# Patient Record
Sex: Female | Born: 1945 | Race: White | Hispanic: No | Marital: Single
Health system: Southern US, Community
[De-identification: ages and names within clinical notes are randomized; demographics above are authoritative.]

---

## 1998-02-02 ENCOUNTER — Ambulatory Visit (HOSPITAL_COMMUNITY): Admission: RE | Admit: 1998-02-02 | Discharge: 1998-02-02 | Payer: Self-pay | Admitting: Obstetrics and Gynecology

## 1998-02-02 ENCOUNTER — Encounter: Payer: Self-pay | Admitting: Obstetrics and Gynecology

## 1998-02-14 ENCOUNTER — Ambulatory Visit (HOSPITAL_COMMUNITY): Admission: RE | Admit: 1998-02-14 | Discharge: 1998-02-14 | Payer: Self-pay | Admitting: Neurosurgery

## 1998-11-14 ENCOUNTER — Encounter: Payer: Self-pay | Admitting: Family Medicine

## 1998-11-14 ENCOUNTER — Ambulatory Visit (HOSPITAL_COMMUNITY): Admission: RE | Admit: 1998-11-14 | Discharge: 1998-11-14 | Payer: Self-pay | Admitting: Family Medicine

## 1999-12-15 ENCOUNTER — Ambulatory Visit (HOSPITAL_COMMUNITY): Admission: RE | Admit: 1999-12-15 | Discharge: 1999-12-15 | Payer: Self-pay | Admitting: Family Medicine

## 1999-12-15 ENCOUNTER — Encounter: Payer: Self-pay | Admitting: Family Medicine

## 2001-08-25 ENCOUNTER — Encounter: Payer: Self-pay | Admitting: Family Medicine

## 2001-08-25 ENCOUNTER — Ambulatory Visit (HOSPITAL_COMMUNITY): Admission: RE | Admit: 2001-08-25 | Discharge: 2001-08-25 | Payer: Self-pay | Admitting: Family Medicine

## 2002-08-29 ENCOUNTER — Encounter: Payer: Self-pay | Admitting: Orthopedic Surgery

## 2002-08-29 ENCOUNTER — Ambulatory Visit (HOSPITAL_COMMUNITY): Admission: RE | Admit: 2002-08-29 | Discharge: 2002-08-29 | Payer: Self-pay | Admitting: Otolaryngology

## 2003-02-25 ENCOUNTER — Ambulatory Visit (HOSPITAL_COMMUNITY): Admission: RE | Admit: 2003-02-25 | Discharge: 2003-02-25 | Payer: Self-pay | Admitting: Specialist

## 2003-02-25 ENCOUNTER — Encounter: Payer: Self-pay | Admitting: Specialist

## 2003-08-04 ENCOUNTER — Inpatient Hospital Stay (HOSPITAL_COMMUNITY): Admission: RE | Admit: 2003-08-04 | Discharge: 2003-08-09 | Payer: Self-pay | Admitting: Specialist

## 2004-04-28 ENCOUNTER — Ambulatory Visit (HOSPITAL_COMMUNITY): Admission: RE | Admit: 2004-04-28 | Discharge: 2004-04-28 | Payer: Self-pay | Admitting: Family Medicine

## 2005-01-11 IMAGING — CR DG KNEE 1-2V*L*
2 series · 2 of 2 positions shown · non-contrast
Comparison: none

CLINICAL DATA: Status-post total left knee replacement. 
 TWO VIEW LEFT KNEE

[view not recorded (1 of 2)]
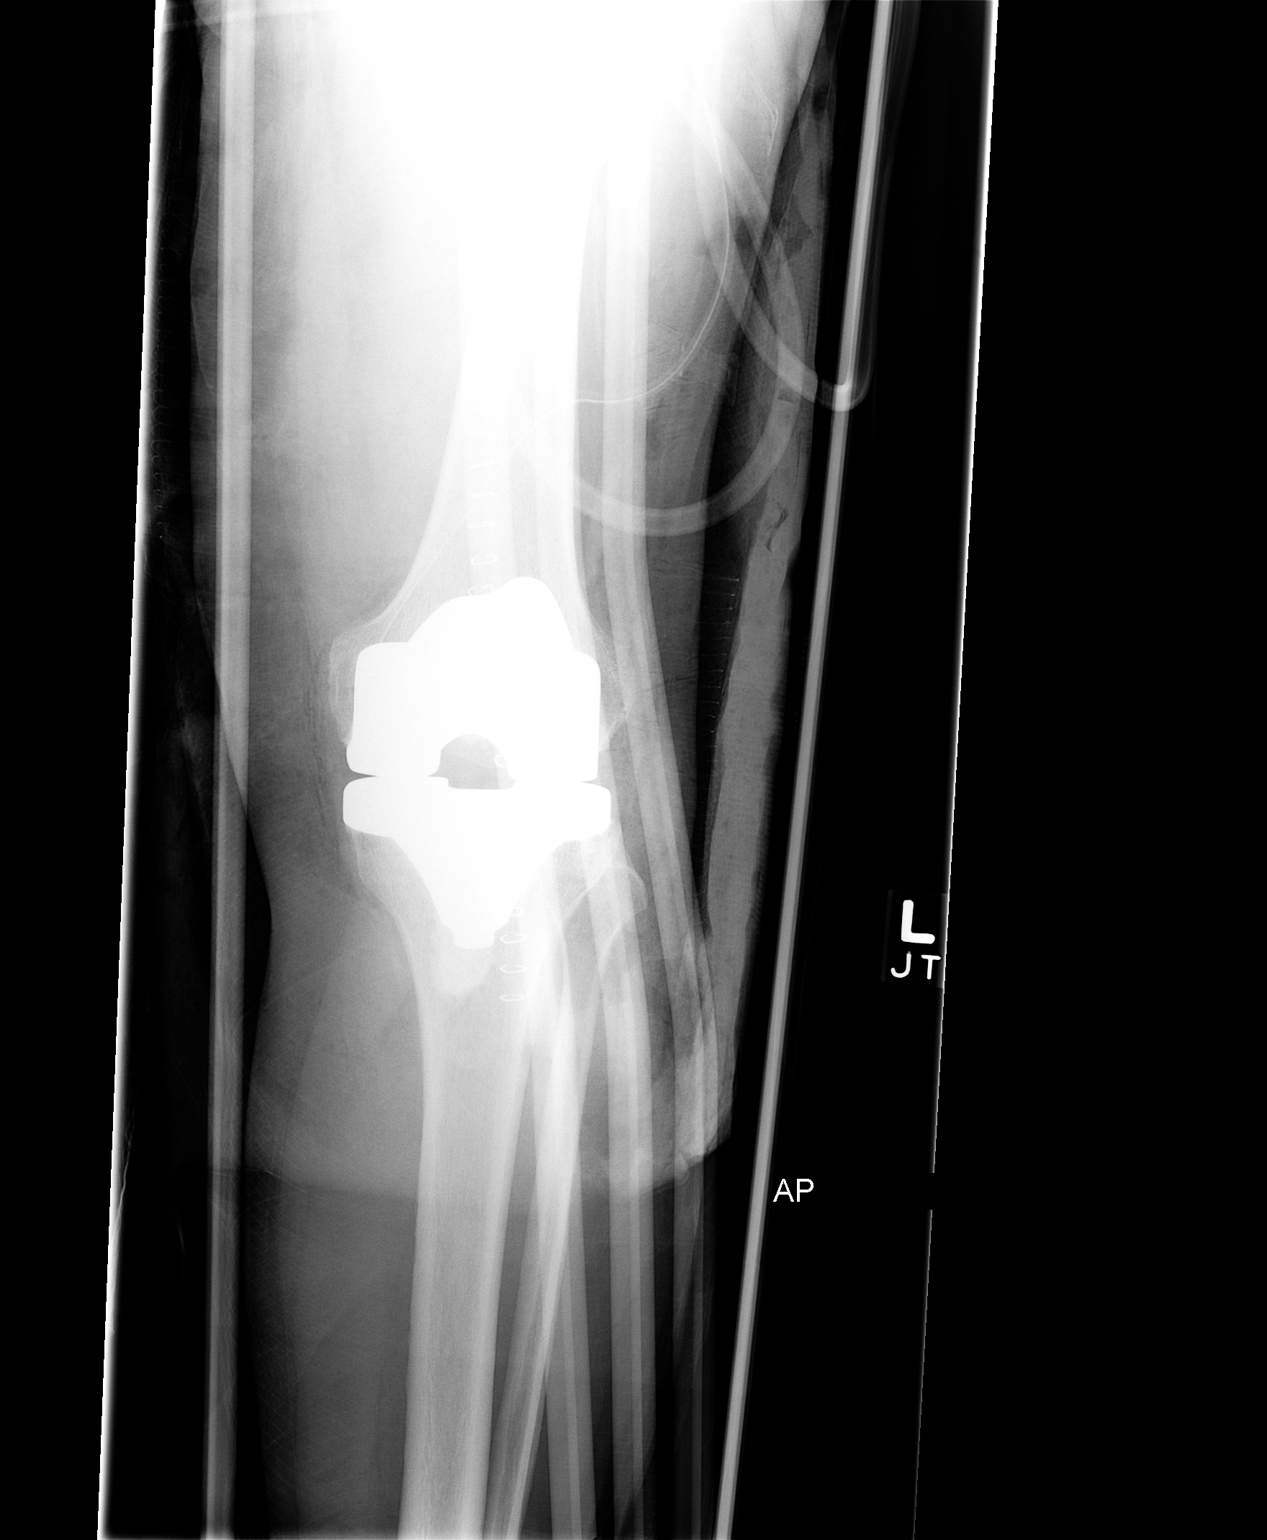

[view not recorded (2 of 2)]
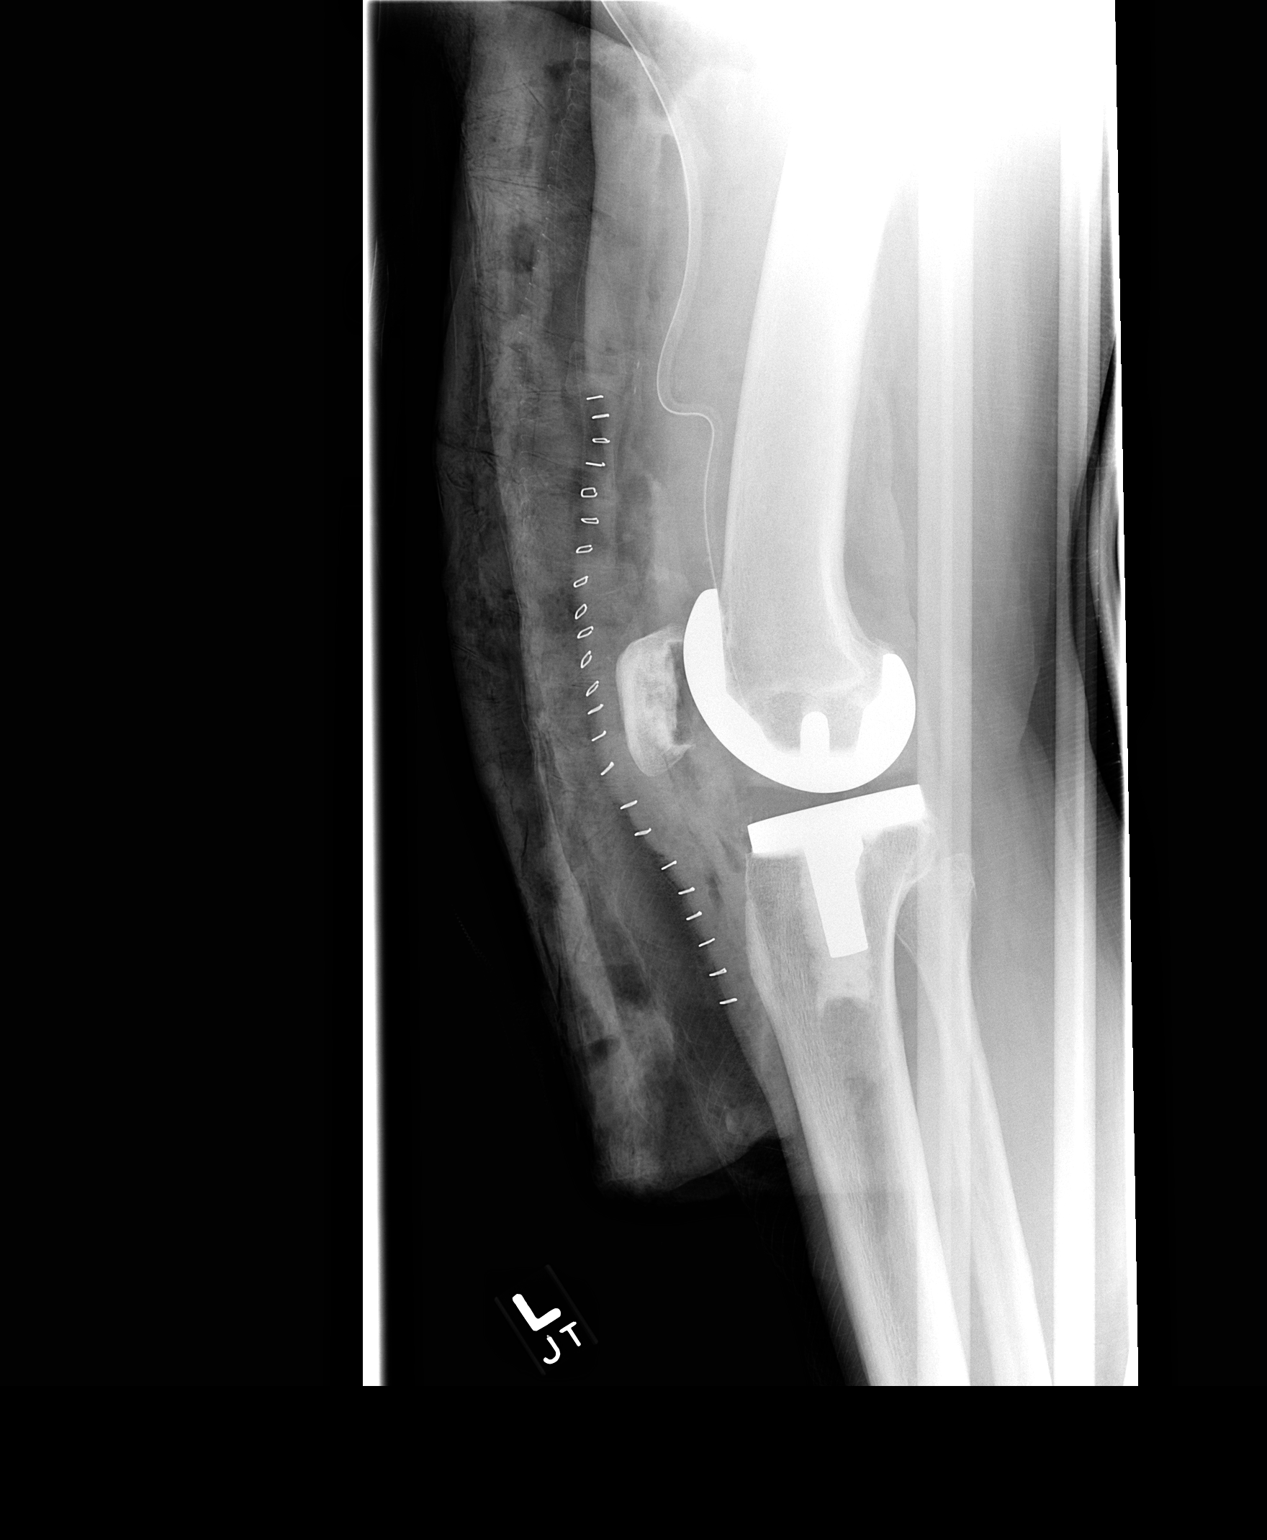

[2 of 2 positions shown; findings below may reference images not displayed]

FINDINGS: Comparison to 07/27/03.  Evidence of left total knee replacement without definite complicating features.  Post-operative changes and overlying drains noted. 
 IMPRESSION
 Left knee replacement without definite complicating features.

## 2005-09-05 ENCOUNTER — Encounter: Payer: Self-pay | Admitting: Neurosurgery

## 2006-07-08 ENCOUNTER — Ambulatory Visit (HOSPITAL_COMMUNITY): Admission: RE | Admit: 2006-07-08 | Discharge: 2006-07-08 | Payer: Self-pay | Admitting: Obstetrics and Gynecology

## 2007-07-10 ENCOUNTER — Ambulatory Visit (HOSPITAL_COMMUNITY): Admission: RE | Admit: 2007-07-10 | Discharge: 2007-07-10 | Payer: Self-pay | Admitting: Family Medicine

## 2007-07-10 ENCOUNTER — Other Ambulatory Visit: Admission: RE | Admit: 2007-07-10 | Discharge: 2007-07-10 | Payer: Self-pay | Admitting: Family Medicine

## 2007-07-21 ENCOUNTER — Encounter: Admission: RE | Admit: 2007-07-21 | Discharge: 2007-07-21 | Payer: Self-pay | Admitting: Family Medicine

## 2008-01-07 ENCOUNTER — Encounter: Admission: RE | Admit: 2008-01-07 | Discharge: 2008-01-07 | Payer: Self-pay | Admitting: Family Medicine

## 2008-07-12 ENCOUNTER — Encounter: Admission: RE | Admit: 2008-07-12 | Discharge: 2008-07-12 | Payer: Self-pay | Admitting: Obstetrics and Gynecology

## 2009-07-14 ENCOUNTER — Ambulatory Visit (HOSPITAL_COMMUNITY): Admission: RE | Admit: 2009-07-14 | Discharge: 2009-07-14 | Payer: Self-pay | Admitting: Family Medicine

## 2010-06-13 ENCOUNTER — Other Ambulatory Visit (HOSPITAL_COMMUNITY): Payer: Self-pay | Admitting: Family Medicine

## 2010-06-13 DIAGNOSIS — Z1231 Encounter for screening mammogram for malignant neoplasm of breast: Secondary | ICD-10-CM

## 2010-07-18 ENCOUNTER — Ambulatory Visit (HOSPITAL_COMMUNITY)
Admission: RE | Admit: 2010-07-18 | Discharge: 2010-07-18 | Disposition: A | Discharge status: Home / Self Care | Payer: MEDICARE | Source: Ambulatory Visit | Attending: Family Medicine | Admitting: Family Medicine

## 2010-07-18 DIAGNOSIS — Z1231 Encounter for screening mammogram for malignant neoplasm of breast: Secondary | ICD-10-CM

## 2010-07-20 ENCOUNTER — Other Ambulatory Visit: Payer: Self-pay | Admitting: Family Medicine

## 2010-07-20 DIAGNOSIS — R928 Other abnormal and inconclusive findings on diagnostic imaging of breast: Secondary | ICD-10-CM

## 2010-07-26 ENCOUNTER — Ambulatory Visit
Admission: RE | Admit: 2010-07-26 | Discharge: 2010-07-26 | Disposition: A | Discharge status: Home / Self Care | Payer: MEDICARE | Source: Ambulatory Visit | Attending: Family Medicine | Admitting: Family Medicine

## 2010-07-26 DIAGNOSIS — R928 Other abnormal and inconclusive findings on diagnostic imaging of breast: Secondary | ICD-10-CM

## 2010-09-22 NOTE — Op Note (Signed)
NAME:  Meghan Black, Meghan Black                       ACCOUNT NO.:  [id number]   MEDICAL RECORD NO.:  [id number]                   PATIENT TYPE:  AMB   LOCATION:  DAY                                  FACILITY:  [facility]   PHYSICIAN:  Jene Every, M.D.                 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  Grade 3 chondromalacia of the patella, left knee.   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  Grade 3 chondromalacia of the patella, left knee.   PROCEDURE:  1. Left knee arthroscopy.  2. Chondroplasty of the patella, left knee.  3. Chondroplasty of the medial tibial plateau, micro-fracture technique,     median tibial plateau, left knee.   SURGEON:  Jene Every, M.D.   ASSISTANT:  None.   ANESTHESIA:  General.   INDICATIONS FOR PROCEDURE:  This is a 65 year old with refractory knee pain,  worse with activity and better with rest.  An MRI indicated a focal ostial  chondral lesion of the medial tibial plateau and degenerative change of the  patella.  Operative intervention was indicated for a chondroplasty and  possible micro-fracture technique.  The risks and benefits were discussed,  including bleeding, infection, no change in symptoms, worsening of the  symptoms, need for a total knee arthroplasty, etc.   DESCRIPTION OF PROCEDURE:  With the patient in the supine position, after  adequate general anesthesia and 1 g of Kefzol and 1 g of vancomycin.  The  left lower extremity was prepped and draped in the usual sterile fashion.  The lateral parapatellar portal and the superior medial parapatellar portal  were fashioned with a #11 blade and engrossed skin was atraumatically  placed.  The area was divided from the incomplete joint.  An arthroscopic  camera was then inserted via the lateral portal.  On direct visualization  the medial parapatellar portal was fashioned with a #11 blade, after the  localization with a #18 gauge needle, sparing the medial meniscus.  Inspection in the suprapatellar pouch revealed some grade 3 changes in the  medial facet of the patella, some grade 3 changes of the femoral sulcus.  There was normal patellofemoral tracking.  The sulcus medially and laterally  was unremarkable.  The ACL and PCL were unremarkable.  Examination of the  lateral compartment revealed some minor grade 2 changes of the tibial  plateau.  The femoral condyle and lateral meniscus were without evidence of  degenerative fraying or tearing.  The meniscus was stable to full palpation.  The midportion of the weightbearing surface of the medial tibial plateau had  some grade 3 changes, and a focal lesion of grade 4 change approximately 5.0  mm.  An awl was introduced and utilization to micro-fracture the area of the  grade 4 lesion, 2.0 mm in depth.  This was spaced appropriately apart after  pressure was reduced.  There was good bleeding from each of the areas.  A  chondroplasty was performed of the medial tibial plateau, to remove all of  the loose cartilagineus debris.  Next, the knee was copiously lavaged.  All compartments were re-examined.  No evidence of loose cartilagineus debris.  The medial meniscus was without  evidence of a tear, and stable to full palpation.  Next, all instrumentation was removed.  The portals were closed with #4-0  nylon simple sutures.  Marcaine 0.25% with epinephrine was infiltrated in  the joint.  The wounds were dressed sterilely.  The patient was awakened without difficulty and transported to the recovery  room in satisfactory condition.  The patient tolerated the procedure well.   COMPLICATIONS:  None.                                                 Jene Every, M.D.    Cordelia Pen  D:  02/25/2003   T:  02/25/2003  Job:  [id number]

## 2010-09-22 NOTE — Op Note (Signed)
Meghan Black, Meghan Black                       ACCOUNT NO.:  [id number]   MEDICAL RECORD NO.:  [id number]                   PATIENT TYPE:  INP   LOCATION:  0002                                 FACILITY:  [facility]   PHYSICIAN:  Jene Every, M.D.                 DATE OF BIRTH:  08/12/1945   DATE OF PROCEDURE:  08/04/2003  DATE OF DISCHARGE:                                 OPERATIVE REPORT   PREOPERATIVE DIAGNOSIS:  Degenerative joint disease, left knee.   POSTOPERATIVE DIAGNOSIS:  Degenerative joint disease, left knee.   PROCEDURE PERFORMED:  Left total knee arthroplasty.   ANESTHESIA:  General.   SURGEON:  Javier Docker, M.D.   ASSISTANT:  Roma Schanz, P.A.   COMPONENTS UTILIZED:  Osteonics posterior cruciate sacrificing 5 femur, 5  tibia, 10 mm insert, 26 patella.   HISTORY:  A 65 year old is status post a micro-fracture technique of grade 4  lesion of the medial tibial plateau.  Progressive pain from the procedure,  joint space narrowing, and patellofemoral pain.  Also had lateral joint line  tenderness.  Patellofemoral pain and compression.  She had a tibia vara.  She developed a flexion contracture preoperatively.  She had been refractory  to conservative treatment including corticosteroid injections, anti-  inflammatory medications.  It was disabling to her.  Was unable to perform  activities of daily living.  The patient had no evidence of extraarticular  pathology.  Operative intervention was indicated for placement of the  degenerated joint.  Risks and benefits discussed including bleeding,  infection, damage to vascular structures, suboptimal range of motion, need  for hardware removal, revision, etc.   TECHNIQUE:  The patient in supine position.  After induction of adequate  general anesthesia and 500 mg of vancomycin, the left lower extremity was  prepped and draped and exsanguinated in the usual sterile fashion.  She had  approximately a 5-10 degree  flexion contracture.  She also had a fairly  significant tibia bowing, more distally than proximally.  She had an axially  rotated tibial tubercle as well.  I made a midline incision over the  patella.  The subcutaneous tissue was dissected.  Electrocautery utilized to  achieve hemostasis.  Medium parapatellar arthrotomy was performed.  The  patella was everted, knee was flexed.  Grade 4 changes were noted throughout  the medial compartment, both on the femoral side as well as on the tibial  side.  There was denuded medial meniscus.  There was some extensive grade 3  and some minor grade 4 changes of the patellofemoral joint as well.  The ACL  was removed.  The medial and lateral menisci was removed.  Elevated the  medial retinaculum and the medial superficial medial collateral ligament.  The knee was flexed.  Step-drill was utilized to enter femoral canal.  This  was irrigated prior to placement of the intramedullary guide.  A 5-degree  left was  utilized, 10 mm, 10 to the distal femur.  Distal femoral cut was  then performed.  Attempt to place distal femoral sizing guide, were unable  to place the sizing guide, therefore I turned to making a proximal tibia  cut, used, the McHale, subluxed the tibia, recessed the PCL.  Baseplate  applied after removing the tibial spine with an oscillating saw.  It was  trialed to a 5, was entered with a step-drill, irrigated, evacuated.  Intramedullary guide placed.  It was measured externally, inserted through  distal tibia.  Again, she had some fair amount of bowing of the tibia  distally.  We used 4 mm off the defect medially, pinned it at 0 degree with  the tissues well protected, oscillating saw utilized to make the tibial cut.  We used the external alignment guide here just to get a rough alignment  medial to the tibial tubercle parallel to the tibia.  Menisci cut was then  made.  Next, following this, we flexed the knee, and we were able then to   place this femoral sizing jig out on the distal femur.  This, too, first  utilized a 5, but felt that pinning for a 5 would notch the anterior femur.  Then down to a 7 __________ for a 7 and utilizing the 5 and felt that the 7  would overhang and provide anterior load to the patellofemoral joint, over-  stuffing that compartment.  I first placed a 5, impacted it, secured it  utilizing the trials to raise the anterior aspect of the femur appropriately  through an anterior and a posterior chamfer cuts.  Anterior and posterior  cuts to soft tissue, posterior well-protected, then remained of chamfer  cuts.  Utilized the posterior cruciate sacrificing, used the patellofemoral  groove chisel, then used the oscillating saw, channeled the punch guide for  the recess posterior cruciate sacrificing, then utilized the cutting block,  removing bone with the rongeur and then used the impactor without difficulty  plus sheeting.  Remnant in the PCL was removed as well.  Next, we trialed a  5 component, found to fit satisfactorily, anterior-posterior and medial  lateral plane and the 10 mm insert, 5 tibia.  It was felt though that the 10  mm insert was insufficient, as we did not achieve full extension.  Therefore, flexed the tibia, placed our retractors, and we then took two  additional mm off the tibial cut.  Following this, then the femoral trial  and the tibial trial fit appropriately, full extension, full flexion, no  lift-off.  Used an external alignment guide medial to the tibial tubercle,  parallel to the tibia, again distally due to the bowing of the tibia with  slight divergence.  We marked it in the appropriate rotation, then removed  the trials, flexed the tibia, subluxed it, pinned.  Our trial was fit  excellent.  I pinned the baseplate which fit excellently.  Then used their  two punch guides.  Then these are removed.  Again, in full extension and full flexion, there was an equal flexion and  extension gap, and the  alignment with the joint line and the floor was appropriate.  Then everted  the patella and sized to a 26, placed a patella clamp.  I measured the  patellar width.  It would accept a 10 mm cut.  We then used a reamer to ream  10 mm depth, used the peg holes.  These were drilled as well.  The  residual  thickness was 12 mm.  This was measured with a caliper.  Next, the knee was  copiously irrigated with pulsatile lavage.  We trialed the femur and the  tibia, full extension, full flexion, good stability to varus valgus  stressing.  No lift-off.  All instrumentation was then removed.  Used  pulsatile lavage to clean the soft tissues and the bone.  Knee was then  flexed after this, dried thoroughly, suctioned appropriately, placed Gelfoam  in the tibial canal as a cement restrictor.  We examined posteriorly, and  there were no osteophytes.  No residual meniscus.  After appropriate mixing  the cement, we injected it into the proximal tibia into the tibial cut.  The  #5 tibial tray impacted and redundant cement removed.  Impacted the femoral  component with cement on the posterior runners, redundant cement removed,  placed a 10 trial, brought her into extension, axial load applied, and  redundant cement removed.  We cemented the patellar component as well,  redundant cement removed after appropriate curing of the cement.  I removed  trial, redundant cement removed, checked the posterior runners of the  femoral component.  That was removed as well.  The cement was removed as  well.  We placed bone wax on the exposed cancellous surfaces, the femoral  and the tibial side.  We had full extension, full flexion, no lift off, and  no instability.  There was slight lateral tracking of the patella.  Therefore lateral retinacular release was performed, preserving the  superolateral geniculate vessels.  Next, the Hemovac was placed and brought  out through a lateral stab wound in the  skin.  Patellar arthrotomy repaired  with #1 Vicryl interrupted figure-of-eight sutures.  Subcutaneous tissue  reapproximated with 2-0 Vicryl simple sutures.  Knee flexion at closure was  again up to 140 degrees, full extension, no instability or gross varus  valgus stressing, good patellofemoral tracking.  After the compression  dressing was applied, the tourniquet was deflated at 2 hours, and there was  adequate revascularization in the left lower extremity appreciated.   The patient tolerated the procedure well with no complications.  Tourniquet  time was 2 hours.                                               Jene Every, M.D.    Cordelia Pen  D:  08/04/2003  T:  08/04/2003  Job:  [id number]

## 2010-09-22 NOTE — Discharge Summary (Signed)
NAMEBRENN, Meghan Black                       ACCOUNT NO.:  [id number]   MEDICAL RECORD NO.:  [id number]                   PATIENT TYPE:  INP   LOCATION:  0479                                 FACILITY:  [facility]   PHYSICIAN:  Jene Every, M.D.                 DATE OF BIRTH:  03-01-1946   DATE OF ADMISSION:  08/04/2003  DATE OF DISCHARGE:  08/09/2003                                 DISCHARGE SUMMARY   ADMISSION DIAGNOSES:  1. Degenerative joint disease left knee.  2. Thyroid disease.  3. Hypercholesterolemia.  4. History of anemia.  5. Hiatal hernia.   DISCHARGE DIAGNOSES:  1. 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  2. Thyroid disease.  3. Hypercholesterolemia.  4. History of anemia.  5. Hiatal hernia.   HISTORY:  Meghan Black is a 65 year old female with a longstanding history  of bilateral knee pain.  The patient did initially undergo bilateral knee  arthroscopies with good relief on the right; however, remained symptomatic  on the left. She underwent multiple cortisone injections as well as a  Synvisc series without significant relief of her symptoms.  MRI of the knee  did show high-grade patellofemoral chondromalacia as well as osteochondral  erosion in the medial tibial plateau.  At this point due to failure, the  patient is to proceed with conservative treatment.  It is felt she would  benefit from a total knee arthroplasty.  Risks and benefits of the surgery  were discussed with the patient by Dr. Shelle Iron and she wished to proceed.   CONSULTATIONS:  PT, OT, Case Management.   PROCEDURE:  The patient was taken to the OR on August 04, 2003 and underwent  left total knee arthroplasty by Dr. Jene Every, assistant Roma Schanz, P.A.-C., anesthesia general, complications none.  One Hemovac drain  was placed at the time of surgery.   LABORATORY VALUES:  Preoperative labs showed a white blood cell count 8.2,  hemoglobin  15.2, hematocrit 44.5.  Routine H&H's were followed throughout  the hospital course.  The patient did drop to a level of 10.4 hemoglobin  with hematocrit of 30.2.  She was stable at the time of discharge with a  hemoglobin of 10.6, hematocrit 31.2.  PT/INR done preoperatively showed PT  of 11.5, INR of 0.8.  Routine coagulation studies were followed throughout  the hospital course.  At the time of discharge, PT was 16.7, INR 1.6.  Routine chemistries done preoperatively showed sodium 143, potassium 5,  glucose 103, normal BUN and creatinine.  Routine chemistries were followed  throughout the hospital course.  Sodium remained normal at the time of  discharge with a level of 139, potassium 3.5.  Slightly elevated glucose of  118 with slight decrease in BUN of 5 with a normal creatinine of 0.7.  Routine liver functions were within normal range.  Routine urinalysis was  negative.  Blood type is A positive.  I do not see a preoperative chest x-  ray or EKG on the chart.   HOSPITAL COURSE:  The patient was taken to the OR and underwent the above-  stated procedure without difficulty.  She was then transferred to the PACU  and then to the orthopedic floor for continued postoperative care.  At time  of surgery, one Hemovac drain was placed and the patient was placed on PC  analgesics for pain relief.  Pharmacy was consulted to begin Coumadin  therapy.  On postoperative day #1, the patient did well.  She was not using  her PCA due to fear of being addicted to morphine.  This was discontinued.  She was started on p.o. medications.  PT/OT was consulted.  The patient did  have a drop in hemoglobin to 11.7, hematocrit 35; however, asymptomatic.  The patient continued to do well throughout the hospital course.  A routine  postoperative plan was followed.  Discharge planning was initiated.  On  postoperative day #5, it was felt the patient could be discharged home.  She  was advancing well with physical  therapy.  Home health had been arranged.  Incision remained clean and dry.  Hemoglobin stabilized at a level of  10.6/31.2 hematocrit.   DISPOSITION:  The patient discharged to home with [facility]  with Coumadin monitoring.   DISCHARGE MEDICATIONS:  Includes Coumadin, Percocet, and Robaxin, as well as  vitamin C.   ACTIVITY:  The patient can walk as tolerated.  She is to elevated her leg 4  to 6 times a day for 20 minutes at the time.  Physical therapy at home with  the use of knee immobilizer until can straight leg raise.   DIET:  No restrictions.   WOUND CARE:  She is to change her dressing daily, keep this clean and dry.  She is to contact us if she has an excess drainage or if fevers develop.   She will follow up with Dr. Shelle Iron in approximately 10 to 14 days  postoperatively.   CONDITION ON DISCHARGE:  Stable.   FINAL DIAGNOSES:  Doing well status post left total knee arthroplasty.     Roma Schanz, P.A.                   Jene Every, M.D.    CS/MEDQ  D:  08/18/2003  T:  08/18/2003  Job:  [id number]

## 2010-09-22 NOTE — H&P (Signed)
NAMECECILEY, Meghan Black                       ACCOUNT NO.:  [id number]   MEDICAL RECORD NO.:  [id number]                   PATIENT TYPE:  INP   LOCATION:  NA                                   [facility]ACILITY[facility]   PHYSICIAN:  Jene Every, M.D.                 DATE OF BIRTH:  04/18/1946   DATE OF ADMISSION:  08/04/2003  DATE OF DISCHARGE:                                HISTORY & PHYSICAL   CHIEF COMPLAINT:  Left knee pain.   HISTORY:  Meghan Black is a 65 year old female with a longstanding history  of bilateral knee pain. The patient underwent bilateral knee arthroscopies  with good relief of the right; however continued to be symptomatic on the  left. She then underwent multiple cortisone injections with Synvisc in a  series without any significant relief of her symptoms. MRI of the knee does  show high grade patellofemoral chondromalacia as well as osteochondral  erosion in the medial tibial plateau. At this point due to the patient's  failure to progress with conservative treatment she would benefit from a  total knee arthroplasty. The risks and benefits of the surgery were  discussed with the patient and she wishes to proceed.   PAST MEDICAL HISTORY:  1. Thyroid disease.  2. Hypercholesterolemia.  3. Question anemia.  4. Hiatal hernia.   CURRENT MEDICATIONS:  Armour thyroid 60 mg one p.o. daily. The patient has  currently stopped other medications which include Premarin 1.25 mg, Soma 350  mg, Donnatal tabs, Lipitor 10 mg.   ALLERGIES:  ASPIRIN which causes GI upset as well as itching and PENICILLIN  for which the patient has an anaphylactic reaction.   PAST SURGICAL HISTORY:  1. Hysterectomy.  2. Microdiskectomy of the neck.  3. Bilateral knee arthoscopy.   SOCIAL HISTORY:  She is single. She works as an Retail banker. She does  smoke approximately a pack of cigarettes per day. She drinks alcohol on  occasion. Sister will be her caregiver following surgery.   FAMILY HISTORY:  Mother deceased at age 48 from coronary artery disease.  Brother has history of cancer. Primary care physician is Dr. Venita Lick.   REVIEW OF SYSTEMS:  GENERAL: The patient denies any fevers, chills,  nightsweats, or bleeding tendency. CNS: No blurred or double vision,  seizure, headache, or paralysis.  RESPIRATORY: No shortness of breath,  productive cough, or hemoptysis. CARDIOVASCULAR: CARDIOVASCULAR: No chest  pain, angina, or orthopnea. GI: No nausea, vomiting, diarrhea, constipation,  melena, or bloody stools. GU: No dysuria, hematuria, or discharge.  MUSCULOSKELETAL: Pertinent to history of present illness.   PHYSICAL EXAMINATION:  VITAL SIGNS: Pulse 90, respirations 16, blood  pressure 140/96.  GENERAL: This is a well-developed, well-nourished 65 year old female in no  acute distress.  HEENT:  Normocephalic and atraumatic. Pupils are equal, round, and reactive  to light. EOMs are intact.  NECK: Supple. No lymphadenopathy.  CHEST: The patient does have  wheezes bibasilar.  BREASTS: Seen, not examined. Not pertinent to HPI.  HEART: Regular rate and rhythm without murmurs, rubs, or gallops.  ABDOMEN: Soft, nontender, nondistended. Bowel sounds times four.  SKIN: No rashes or lesions are noted.  EXTREMITIES: In regards to the knee, the patient does walk with antalgic  gait. She does have slight effusion. Range of motion is -5 to 120 degrees.  She is tender in the medial joint pain with patella femoral compression.   X-rays reveal degenerative disk disease of left knee.   IMPRESSION:  Degenerative joint disease in the left knee.   PLAN:  The patient will be admitted to [facility] and will undergo  a left total knee arthroplasty.     Roma Schanz, P.A.                   Jene Every, M.D.    CS/MEDQ  D:  08/03/2003  T:  08/03/2003  Job:  [id number]

## 2011-06-13 ENCOUNTER — Other Ambulatory Visit (HOSPITAL_COMMUNITY): Payer: Self-pay | Admitting: Family Medicine

## 2011-06-13 DIAGNOSIS — Z1231 Encounter for screening mammogram for malignant neoplasm of breast: Secondary | ICD-10-CM

## 2011-07-31 ENCOUNTER — Ambulatory Visit (HOSPITAL_COMMUNITY)
Admission: RE | Admit: 2011-07-31 | Discharge: 2011-07-31 | Disposition: A | Discharge status: Home / Self Care | Payer: PRIVATE HEALTH INSURANCE | Source: Ambulatory Visit | Attending: Family Medicine | Admitting: Family Medicine

## 2011-07-31 DIAGNOSIS — Z1231 Encounter for screening mammogram for malignant neoplasm of breast: Secondary | ICD-10-CM | POA: Insufficient documentation

## 2012-07-02 ENCOUNTER — Other Ambulatory Visit (HOSPITAL_COMMUNITY): Payer: Self-pay | Admitting: Obstetrics and Gynecology

## 2012-07-02 DIAGNOSIS — Z1231 Encounter for screening mammogram for malignant neoplasm of breast: Secondary | ICD-10-CM

## 2012-07-31 ENCOUNTER — Ambulatory Visit (HOSPITAL_COMMUNITY)
Admission: RE | Admit: 2012-07-31 | Discharge: 2012-07-31 | Disposition: A | Discharge status: Home / Self Care | Payer: Medicare Other | Source: Ambulatory Visit | Attending: Obstetrics and Gynecology | Admitting: Obstetrics and Gynecology

## 2012-07-31 DIAGNOSIS — Z1231 Encounter for screening mammogram for malignant neoplasm of breast: Secondary | ICD-10-CM | POA: Insufficient documentation

## 2012-08-07 ENCOUNTER — Other Ambulatory Visit: Payer: Self-pay | Admitting: Obstetrics and Gynecology

## 2012-08-07 DIAGNOSIS — R928 Other abnormal and inconclusive findings on diagnostic imaging of breast: Secondary | ICD-10-CM

## 2012-08-14 ENCOUNTER — Other Ambulatory Visit: Payer: Self-pay | Admitting: Family Medicine

## 2012-08-14 DIAGNOSIS — R928 Other abnormal and inconclusive findings on diagnostic imaging of breast: Secondary | ICD-10-CM

## 2012-08-20 ENCOUNTER — Ambulatory Visit
Admission: RE | Admit: 2012-08-20 | Discharge: 2012-08-20 | Disposition: A | Discharge status: Home / Self Care | Payer: Medicare Other | Source: Ambulatory Visit | Attending: Obstetrics and Gynecology | Admitting: Obstetrics and Gynecology

## 2012-08-20 ENCOUNTER — Ambulatory Visit
Admission: RE | Admit: 2012-08-20 | Discharge: 2012-08-20 | Disposition: A | Discharge status: Home / Self Care | Payer: Medicare Other | Source: Ambulatory Visit | Attending: Family Medicine | Admitting: Family Medicine

## 2012-08-20 ENCOUNTER — Other Ambulatory Visit: Payer: Self-pay | Admitting: Family Medicine

## 2012-08-20 DIAGNOSIS — R928 Other abnormal and inconclusive findings on diagnostic imaging of breast: Secondary | ICD-10-CM

## 2012-08-20 HISTORY — PX: BREAST CYST ASPIRATION: SHX578

## 2013-06-16 ENCOUNTER — Other Ambulatory Visit: Payer: Self-pay

## 2013-06-16 DIAGNOSIS — Z1231 Encounter for screening mammogram for malignant neoplasm of breast: Secondary | ICD-10-CM

## 2013-08-03 ENCOUNTER — Ambulatory Visit
Admission: RE | Admit: 2013-08-03 | Discharge: 2013-08-03 | Disposition: A | Discharge status: Home / Self Care | Payer: Self-pay | Source: Ambulatory Visit

## 2013-08-03 DIAGNOSIS — Z1231 Encounter for screening mammogram for malignant neoplasm of breast: Secondary | ICD-10-CM

## 2013-08-06 ENCOUNTER — Other Ambulatory Visit: Payer: Self-pay | Admitting: Obstetrics and Gynecology

## 2013-08-06 ENCOUNTER — Other Ambulatory Visit: Payer: Self-pay | Admitting: Family Medicine

## 2013-08-06 DIAGNOSIS — R928 Other abnormal and inconclusive findings on diagnostic imaging of breast: Secondary | ICD-10-CM

## 2013-08-18 ENCOUNTER — Ambulatory Visit
Admission: RE | Admit: 2013-08-18 | Discharge: 2013-08-18 | Disposition: A | Discharge status: Home / Self Care | Payer: Medicare Other | Source: Ambulatory Visit | Attending: Family Medicine | Admitting: Family Medicine

## 2013-08-18 ENCOUNTER — Other Ambulatory Visit: Payer: Self-pay | Admitting: Family Medicine

## 2013-08-18 DIAGNOSIS — R928 Other abnormal and inconclusive findings on diagnostic imaging of breast: Secondary | ICD-10-CM

## 2013-08-18 HISTORY — PX: BREAST CYST ASPIRATION: SHX578

## 2014-08-05 ENCOUNTER — Other Ambulatory Visit: Payer: Self-pay | Admitting: Obstetrics and Gynecology

## 2014-08-05 DIAGNOSIS — N6002 Solitary cyst of left breast: Secondary | ICD-10-CM

## 2014-08-17 ENCOUNTER — Ambulatory Visit
Admission: RE | Admit: 2014-08-17 | Discharge: 2014-08-17 | Disposition: A | Discharge status: Home / Self Care | Payer: Medicare Other | Source: Ambulatory Visit | Attending: Obstetrics and Gynecology | Admitting: Obstetrics and Gynecology

## 2014-08-17 DIAGNOSIS — N6002 Solitary cyst of left breast: Secondary | ICD-10-CM

## 2015-07-12 ENCOUNTER — Other Ambulatory Visit: Payer: Self-pay | Admitting: Family Medicine

## 2015-07-12 DIAGNOSIS — N6320 Unspecified lump in the left breast, unspecified quadrant: Secondary | ICD-10-CM

## 2015-08-18 ENCOUNTER — Ambulatory Visit
Admission: RE | Admit: 2015-08-18 | Discharge: 2015-08-18 | Disposition: A | Discharge status: Home / Self Care | Payer: Medicare Other | Source: Ambulatory Visit | Attending: Family Medicine | Admitting: Family Medicine

## 2015-08-18 DIAGNOSIS — N6320 Unspecified lump in the left breast, unspecified quadrant: Secondary | ICD-10-CM

## 2016-08-06 ENCOUNTER — Other Ambulatory Visit: Payer: Self-pay | Admitting: Family Medicine

## 2016-08-06 DIAGNOSIS — Z1231 Encounter for screening mammogram for malignant neoplasm of breast: Secondary | ICD-10-CM

## 2016-09-10 ENCOUNTER — Ambulatory Visit
Admission: RE | Admit: 2016-09-10 | Discharge: 2016-09-10 | Disposition: A | Discharge status: Home / Self Care | Payer: Medicare Other | Source: Ambulatory Visit | Attending: Family Medicine | Admitting: Family Medicine

## 2016-09-10 DIAGNOSIS — Z1231 Encounter for screening mammogram for malignant neoplasm of breast: Secondary | ICD-10-CM

## 2017-08-01 ENCOUNTER — Other Ambulatory Visit: Payer: Self-pay | Admitting: Family Medicine

## 2017-08-01 DIAGNOSIS — Z1231 Encounter for screening mammogram for malignant neoplasm of breast: Secondary | ICD-10-CM

## 2017-10-03 ENCOUNTER — Ambulatory Visit
Admission: RE | Admit: 2017-10-03 | Discharge: 2017-10-03 | Disposition: A | Discharge status: Home / Self Care | Payer: Medicare Other | Source: Ambulatory Visit | Attending: Family Medicine | Admitting: Family Medicine

## 2017-10-03 DIAGNOSIS — Z1231 Encounter for screening mammogram for malignant neoplasm of breast: Secondary | ICD-10-CM

## 2019-10-28 ENCOUNTER — Other Ambulatory Visit: Payer: Self-pay | Admitting: Family Medicine

## 2019-10-28 DIAGNOSIS — Z1231 Encounter for screening mammogram for malignant neoplasm of breast: Secondary | ICD-10-CM

## 2019-11-20 ENCOUNTER — Ambulatory Visit
Admission: RE | Admit: 2019-11-20 | Discharge: 2019-11-20 | Disposition: A | Discharge status: Home / Self Care | Payer: Medicare Other | Source: Ambulatory Visit | Attending: Family Medicine | Admitting: Family Medicine

## 2019-11-20 ENCOUNTER — Other Ambulatory Visit: Payer: Self-pay

## 2019-11-20 DIAGNOSIS — Z1231 Encounter for screening mammogram for malignant neoplasm of breast: Secondary | ICD-10-CM

## 2020-06-02 DIAGNOSIS — Z79899 Other long term (current) drug therapy: Secondary | ICD-10-CM | POA: Diagnosis not present

## 2020-06-02 DIAGNOSIS — M5416 Radiculopathy, lumbar region: Secondary | ICD-10-CM | POA: Diagnosis not present

## 2020-06-02 DIAGNOSIS — M5127 Other intervertebral disc displacement, lumbosacral region: Secondary | ICD-10-CM | POA: Diagnosis not present

## 2020-06-13 DIAGNOSIS — Z79899 Other long term (current) drug therapy: Secondary | ICD-10-CM | POA: Diagnosis not present

## 2020-06-20 DIAGNOSIS — E7800 Pure hypercholesterolemia, unspecified: Secondary | ICD-10-CM | POA: Diagnosis not present

## 2020-06-20 DIAGNOSIS — M6249 Contracture of muscle, multiple sites: Secondary | ICD-10-CM | POA: Diagnosis not present

## 2020-06-30 DIAGNOSIS — M461 Sacroiliitis, not elsewhere classified: Secondary | ICD-10-CM | POA: Diagnosis not present

## 2020-07-04 DIAGNOSIS — E7800 Pure hypercholesterolemia, unspecified: Secondary | ICD-10-CM | POA: Diagnosis not present

## 2020-07-04 DIAGNOSIS — I10 Essential (primary) hypertension: Secondary | ICD-10-CM | POA: Diagnosis not present

## 2020-07-04 DIAGNOSIS — J449 Chronic obstructive pulmonary disease, unspecified: Secondary | ICD-10-CM | POA: Diagnosis not present

## 2020-07-04 DIAGNOSIS — M179 Osteoarthritis of knee, unspecified: Secondary | ICD-10-CM | POA: Diagnosis not present

## 2020-07-04 DIAGNOSIS — E039 Hypothyroidism, unspecified: Secondary | ICD-10-CM | POA: Diagnosis not present

## 2020-08-03 DIAGNOSIS — M47817 Spondylosis without myelopathy or radiculopathy, lumbosacral region: Secondary | ICD-10-CM | POA: Diagnosis not present

## 2020-08-30 DIAGNOSIS — M461 Sacroiliitis, not elsewhere classified: Secondary | ICD-10-CM | POA: Diagnosis not present

## 2020-09-22 DIAGNOSIS — E7800 Pure hypercholesterolemia, unspecified: Secondary | ICD-10-CM | POA: Diagnosis not present

## 2020-09-22 DIAGNOSIS — E039 Hypothyroidism, unspecified: Secondary | ICD-10-CM | POA: Diagnosis not present

## 2020-09-22 DIAGNOSIS — I10 Essential (primary) hypertension: Secondary | ICD-10-CM | POA: Diagnosis not present

## 2020-09-22 DIAGNOSIS — J449 Chronic obstructive pulmonary disease, unspecified: Secondary | ICD-10-CM | POA: Diagnosis not present

## 2020-09-22 DIAGNOSIS — M179 Osteoarthritis of knee, unspecified: Secondary | ICD-10-CM | POA: Diagnosis not present

## 2020-10-06 ENCOUNTER — Other Ambulatory Visit: Payer: Self-pay | Admitting: Family Medicine

## 2020-10-06 DIAGNOSIS — Z1231 Encounter for screening mammogram for malignant neoplasm of breast: Secondary | ICD-10-CM

## 2020-11-16 DIAGNOSIS — E7800 Pure hypercholesterolemia, unspecified: Secondary | ICD-10-CM | POA: Diagnosis not present

## 2020-11-16 DIAGNOSIS — M179 Osteoarthritis of knee, unspecified: Secondary | ICD-10-CM | POA: Diagnosis not present

## 2020-11-16 DIAGNOSIS — I10 Essential (primary) hypertension: Secondary | ICD-10-CM | POA: Diagnosis not present

## 2020-11-16 DIAGNOSIS — E039 Hypothyroidism, unspecified: Secondary | ICD-10-CM | POA: Diagnosis not present

## 2020-11-16 DIAGNOSIS — J449 Chronic obstructive pulmonary disease, unspecified: Secondary | ICD-10-CM | POA: Diagnosis not present

## 2020-12-02 ENCOUNTER — Ambulatory Visit
Admission: RE | Admit: 2020-12-02 | Discharge: 2020-12-02 | Disposition: A | Discharge status: Home / Self Care | Payer: Medicare Other | Source: Ambulatory Visit | Attending: Family Medicine | Admitting: Family Medicine

## 2020-12-02 ENCOUNTER — Other Ambulatory Visit: Payer: Self-pay

## 2020-12-02 ENCOUNTER — Ambulatory Visit: Payer: Medicare Other

## 2020-12-02 DIAGNOSIS — M6249 Contracture of muscle, multiple sites: Secondary | ICD-10-CM | POA: Diagnosis not present

## 2020-12-02 DIAGNOSIS — E7800 Pure hypercholesterolemia, unspecified: Secondary | ICD-10-CM | POA: Diagnosis not present

## 2020-12-02 DIAGNOSIS — R21 Rash and other nonspecific skin eruption: Secondary | ICD-10-CM | POA: Diagnosis not present

## 2020-12-02 DIAGNOSIS — Z1231 Encounter for screening mammogram for malignant neoplasm of breast: Secondary | ICD-10-CM

## 2020-12-02 DIAGNOSIS — I10 Essential (primary) hypertension: Secondary | ICD-10-CM | POA: Diagnosis not present

## 2020-12-02 DIAGNOSIS — E039 Hypothyroidism, unspecified: Secondary | ICD-10-CM | POA: Diagnosis not present

## 2020-12-02 DIAGNOSIS — J449 Chronic obstructive pulmonary disease, unspecified: Secondary | ICD-10-CM | POA: Diagnosis not present

## 2020-12-02 DIAGNOSIS — N390 Urinary tract infection, site not specified: Secondary | ICD-10-CM | POA: Diagnosis not present

## 2020-12-02 DIAGNOSIS — Z0000 Encounter for general adult medical examination without abnormal findings: Secondary | ICD-10-CM | POA: Diagnosis not present

## 2020-12-02 DIAGNOSIS — R194 Change in bowel habit: Secondary | ICD-10-CM | POA: Diagnosis not present

## 2021-01-04 DIAGNOSIS — Z86010 Personal history of colonic polyps: Secondary | ICD-10-CM | POA: Diagnosis not present

## 2021-01-04 DIAGNOSIS — Z800 Family history of malignant neoplasm of digestive organs: Secondary | ICD-10-CM | POA: Diagnosis not present

## 2021-02-01 DIAGNOSIS — E7800 Pure hypercholesterolemia, unspecified: Secondary | ICD-10-CM | POA: Diagnosis not present

## 2021-02-01 DIAGNOSIS — I10 Essential (primary) hypertension: Secondary | ICD-10-CM | POA: Diagnosis not present

## 2021-02-01 DIAGNOSIS — J449 Chronic obstructive pulmonary disease, unspecified: Secondary | ICD-10-CM | POA: Diagnosis not present

## 2021-02-01 DIAGNOSIS — M179 Osteoarthritis of knee, unspecified: Secondary | ICD-10-CM | POA: Diagnosis not present

## 2021-02-01 DIAGNOSIS — E039 Hypothyroidism, unspecified: Secondary | ICD-10-CM | POA: Diagnosis not present

## 2021-02-07 DIAGNOSIS — Z86010 Personal history of colonic polyps: Secondary | ICD-10-CM | POA: Diagnosis not present

## 2021-02-07 DIAGNOSIS — I10 Essential (primary) hypertension: Secondary | ICD-10-CM | POA: Diagnosis not present

## 2021-02-07 DIAGNOSIS — K5989 Other specified functional intestinal disorders: Secondary | ICD-10-CM | POA: Diagnosis not present

## 2021-02-07 DIAGNOSIS — Z1211 Encounter for screening for malignant neoplasm of colon: Secondary | ICD-10-CM | POA: Diagnosis not present

## 2021-02-07 DIAGNOSIS — J45909 Unspecified asthma, uncomplicated: Secondary | ICD-10-CM | POA: Diagnosis not present

## 2021-02-07 DIAGNOSIS — D123 Benign neoplasm of transverse colon: Secondary | ICD-10-CM | POA: Diagnosis not present

## 2021-02-07 DIAGNOSIS — K635 Polyp of colon: Secondary | ICD-10-CM | POA: Diagnosis not present

## 2021-04-26 DIAGNOSIS — M179 Osteoarthritis of knee, unspecified: Secondary | ICD-10-CM | POA: Diagnosis not present

## 2021-04-26 DIAGNOSIS — E039 Hypothyroidism, unspecified: Secondary | ICD-10-CM | POA: Diagnosis not present

## 2021-04-26 DIAGNOSIS — J449 Chronic obstructive pulmonary disease, unspecified: Secondary | ICD-10-CM | POA: Diagnosis not present

## 2021-04-26 DIAGNOSIS — I10 Essential (primary) hypertension: Secondary | ICD-10-CM | POA: Diagnosis not present

## 2021-04-26 DIAGNOSIS — E7800 Pure hypercholesterolemia, unspecified: Secondary | ICD-10-CM | POA: Diagnosis not present

## 2021-05-24 DIAGNOSIS — M6249 Contracture of muscle, multiple sites: Secondary | ICD-10-CM | POA: Diagnosis not present

## 2021-05-24 DIAGNOSIS — J449 Chronic obstructive pulmonary disease, unspecified: Secondary | ICD-10-CM | POA: Diagnosis not present

## 2021-05-24 DIAGNOSIS — K1379 Other lesions of oral mucosa: Secondary | ICD-10-CM | POA: Diagnosis not present

## 2021-07-10 DIAGNOSIS — M9902 Segmental and somatic dysfunction of thoracic region: Secondary | ICD-10-CM | POA: Diagnosis not present

## 2021-07-10 DIAGNOSIS — M9903 Segmental and somatic dysfunction of lumbar region: Secondary | ICD-10-CM | POA: Diagnosis not present

## 2021-07-10 DIAGNOSIS — M9904 Segmental and somatic dysfunction of sacral region: Secondary | ICD-10-CM | POA: Diagnosis not present

## 2021-08-07 DIAGNOSIS — M9904 Segmental and somatic dysfunction of sacral region: Secondary | ICD-10-CM | POA: Diagnosis not present

## 2021-08-07 DIAGNOSIS — M9903 Segmental and somatic dysfunction of lumbar region: Secondary | ICD-10-CM | POA: Diagnosis not present

## 2021-08-07 DIAGNOSIS — M9902 Segmental and somatic dysfunction of thoracic region: Secondary | ICD-10-CM | POA: Diagnosis not present

## 2021-08-28 DIAGNOSIS — M9902 Segmental and somatic dysfunction of thoracic region: Secondary | ICD-10-CM | POA: Diagnosis not present

## 2021-08-28 DIAGNOSIS — M9904 Segmental and somatic dysfunction of sacral region: Secondary | ICD-10-CM | POA: Diagnosis not present

## 2021-08-28 DIAGNOSIS — M9903 Segmental and somatic dysfunction of lumbar region: Secondary | ICD-10-CM | POA: Diagnosis not present

## 2021-09-11 DIAGNOSIS — M9904 Segmental and somatic dysfunction of sacral region: Secondary | ICD-10-CM | POA: Diagnosis not present

## 2021-09-11 DIAGNOSIS — M9903 Segmental and somatic dysfunction of lumbar region: Secondary | ICD-10-CM | POA: Diagnosis not present

## 2021-09-11 DIAGNOSIS — M9902 Segmental and somatic dysfunction of thoracic region: Secondary | ICD-10-CM | POA: Diagnosis not present

## 2021-10-31 DIAGNOSIS — H6593 Unspecified nonsuppurative otitis media, bilateral: Secondary | ICD-10-CM | POA: Diagnosis not present

## 2021-10-31 DIAGNOSIS — B3781 Candidal esophagitis: Secondary | ICD-10-CM | POA: Diagnosis not present

## 2021-10-31 DIAGNOSIS — B370 Candidal stomatitis: Secondary | ICD-10-CM | POA: Diagnosis not present

## 2021-10-31 DIAGNOSIS — H6980 Other specified disorders of Eustachian tube, unspecified ear: Secondary | ICD-10-CM | POA: Diagnosis not present

## 2021-11-13 ENCOUNTER — Other Ambulatory Visit: Payer: Self-pay | Admitting: Family Medicine

## 2021-11-13 DIAGNOSIS — Z1231 Encounter for screening mammogram for malignant neoplasm of breast: Secondary | ICD-10-CM

## 2021-12-21 DIAGNOSIS — M9904 Segmental and somatic dysfunction of sacral region: Secondary | ICD-10-CM | POA: Diagnosis not present

## 2021-12-21 DIAGNOSIS — M9902 Segmental and somatic dysfunction of thoracic region: Secondary | ICD-10-CM | POA: Diagnosis not present

## 2021-12-21 DIAGNOSIS — M9903 Segmental and somatic dysfunction of lumbar region: Secondary | ICD-10-CM | POA: Diagnosis not present

## 2022-01-02 DIAGNOSIS — M9903 Segmental and somatic dysfunction of lumbar region: Secondary | ICD-10-CM | POA: Diagnosis not present

## 2022-01-02 DIAGNOSIS — M9904 Segmental and somatic dysfunction of sacral region: Secondary | ICD-10-CM | POA: Diagnosis not present

## 2022-01-02 DIAGNOSIS — M9902 Segmental and somatic dysfunction of thoracic region: Secondary | ICD-10-CM | POA: Diagnosis not present

## 2022-01-09 DIAGNOSIS — M9903 Segmental and somatic dysfunction of lumbar region: Secondary | ICD-10-CM | POA: Diagnosis not present

## 2022-01-09 DIAGNOSIS — M9902 Segmental and somatic dysfunction of thoracic region: Secondary | ICD-10-CM | POA: Diagnosis not present

## 2022-01-09 DIAGNOSIS — M9904 Segmental and somatic dysfunction of sacral region: Secondary | ICD-10-CM | POA: Diagnosis not present

## 2022-01-10 ENCOUNTER — Ambulatory Visit: Payer: Medicare Other

## 2022-01-10 DIAGNOSIS — E039 Hypothyroidism, unspecified: Secondary | ICD-10-CM | POA: Diagnosis not present

## 2022-01-10 DIAGNOSIS — R21 Rash and other nonspecific skin eruption: Secondary | ICD-10-CM | POA: Diagnosis not present

## 2022-01-10 DIAGNOSIS — Z0000 Encounter for general adult medical examination without abnormal findings: Secondary | ICD-10-CM | POA: Diagnosis not present

## 2022-01-10 DIAGNOSIS — M6249 Contracture of muscle, multiple sites: Secondary | ICD-10-CM | POA: Diagnosis not present

## 2022-01-10 DIAGNOSIS — N390 Urinary tract infection, site not specified: Secondary | ICD-10-CM | POA: Diagnosis not present

## 2022-01-10 DIAGNOSIS — I10 Essential (primary) hypertension: Secondary | ICD-10-CM | POA: Diagnosis not present

## 2022-01-10 DIAGNOSIS — J449 Chronic obstructive pulmonary disease, unspecified: Secondary | ICD-10-CM | POA: Diagnosis not present

## 2022-01-10 DIAGNOSIS — E7800 Pure hypercholesterolemia, unspecified: Secondary | ICD-10-CM | POA: Diagnosis not present

## 2022-01-17 DIAGNOSIS — Z1231 Encounter for screening mammogram for malignant neoplasm of breast: Secondary | ICD-10-CM | POA: Diagnosis not present

## 2022-01-18 DIAGNOSIS — M9904 Segmental and somatic dysfunction of sacral region: Secondary | ICD-10-CM | POA: Diagnosis not present

## 2022-01-18 DIAGNOSIS — M9903 Segmental and somatic dysfunction of lumbar region: Secondary | ICD-10-CM | POA: Diagnosis not present

## 2022-01-18 DIAGNOSIS — M9902 Segmental and somatic dysfunction of thoracic region: Secondary | ICD-10-CM | POA: Diagnosis not present

## 2022-01-25 DIAGNOSIS — M9902 Segmental and somatic dysfunction of thoracic region: Secondary | ICD-10-CM | POA: Diagnosis not present

## 2022-01-25 DIAGNOSIS — M9903 Segmental and somatic dysfunction of lumbar region: Secondary | ICD-10-CM | POA: Diagnosis not present

## 2022-01-25 DIAGNOSIS — M9904 Segmental and somatic dysfunction of sacral region: Secondary | ICD-10-CM | POA: Diagnosis not present

## 2022-04-12 DIAGNOSIS — M9902 Segmental and somatic dysfunction of thoracic region: Secondary | ICD-10-CM | POA: Diagnosis not present

## 2022-04-12 DIAGNOSIS — M9903 Segmental and somatic dysfunction of lumbar region: Secondary | ICD-10-CM | POA: Diagnosis not present

## 2022-04-12 DIAGNOSIS — M9904 Segmental and somatic dysfunction of sacral region: Secondary | ICD-10-CM | POA: Diagnosis not present

## 2022-04-26 DIAGNOSIS — M9903 Segmental and somatic dysfunction of lumbar region: Secondary | ICD-10-CM | POA: Diagnosis not present

## 2022-04-26 DIAGNOSIS — M9904 Segmental and somatic dysfunction of sacral region: Secondary | ICD-10-CM | POA: Diagnosis not present

## 2022-04-26 DIAGNOSIS — M9902 Segmental and somatic dysfunction of thoracic region: Secondary | ICD-10-CM | POA: Diagnosis not present

## 2022-05-10 DIAGNOSIS — M9903 Segmental and somatic dysfunction of lumbar region: Secondary | ICD-10-CM | POA: Diagnosis not present

## 2022-05-10 DIAGNOSIS — M9904 Segmental and somatic dysfunction of sacral region: Secondary | ICD-10-CM | POA: Diagnosis not present

## 2022-05-10 DIAGNOSIS — M9902 Segmental and somatic dysfunction of thoracic region: Secondary | ICD-10-CM | POA: Diagnosis not present

## 2022-05-12 IMAGING — MG MM DIGITAL SCREENING BILAT W/ TOMO AND CAD
8 series · 8 of 24 positions shown · non-contrast
Comparison: Previous exam(s).

CLINICAL DATA: Screening.

EXAM:
DIGITAL SCREENING BILATERAL MAMMOGRAM WITH TOMOSYNTHESIS AND CAD
TECHNIQUE: Bilateral screening digital craniocaudal and mediolateral oblique
mammograms were obtained. Bilateral screening digital breast
tomosynthesis was performed. The images were evaluated with
computer-aided detection.

[L MLO synth-2D]
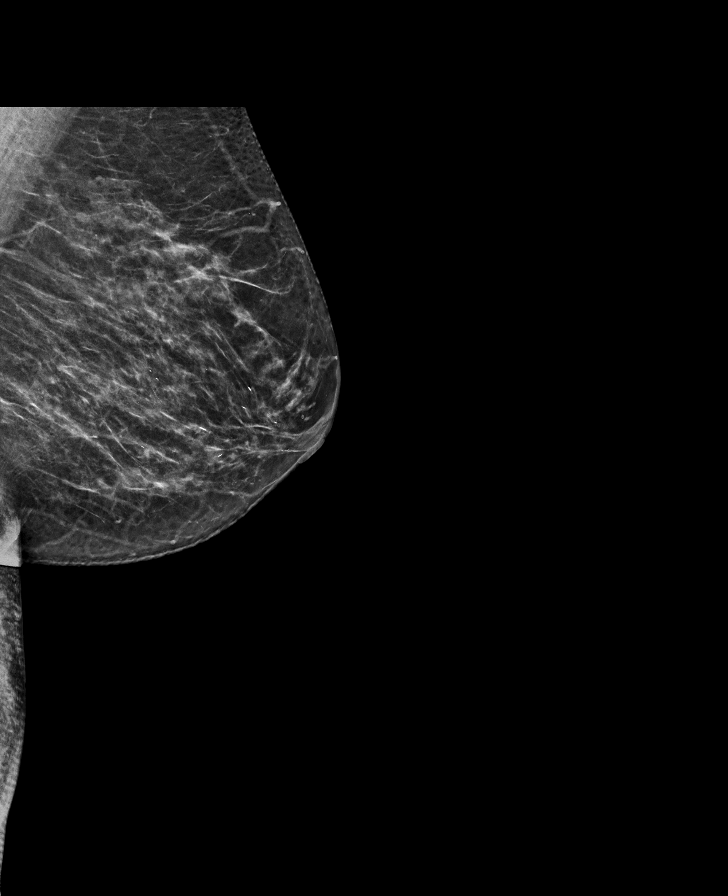

[L CC synth-2D]
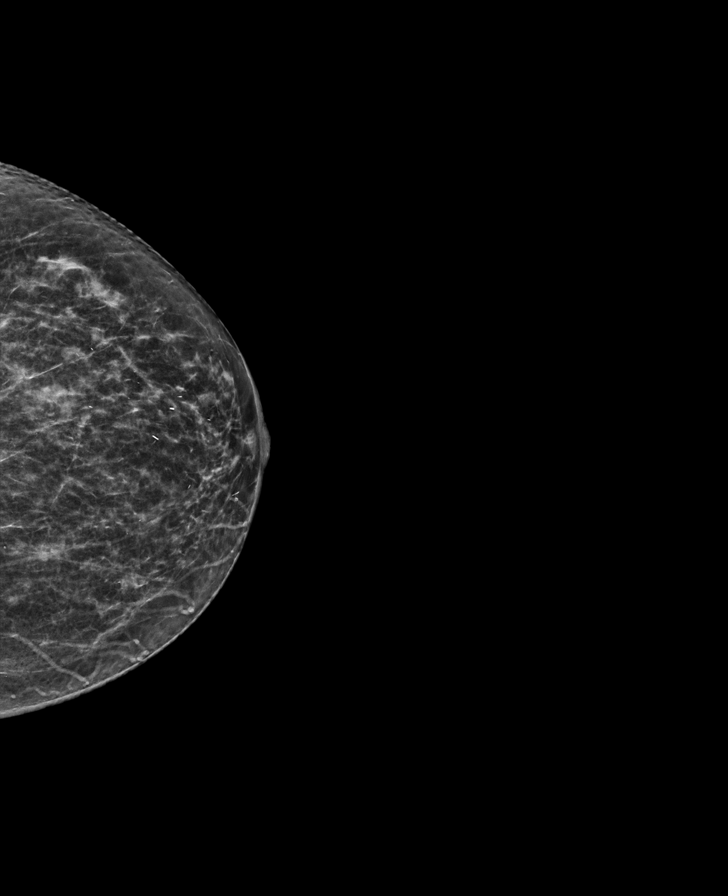

[R MLO synth-2D]
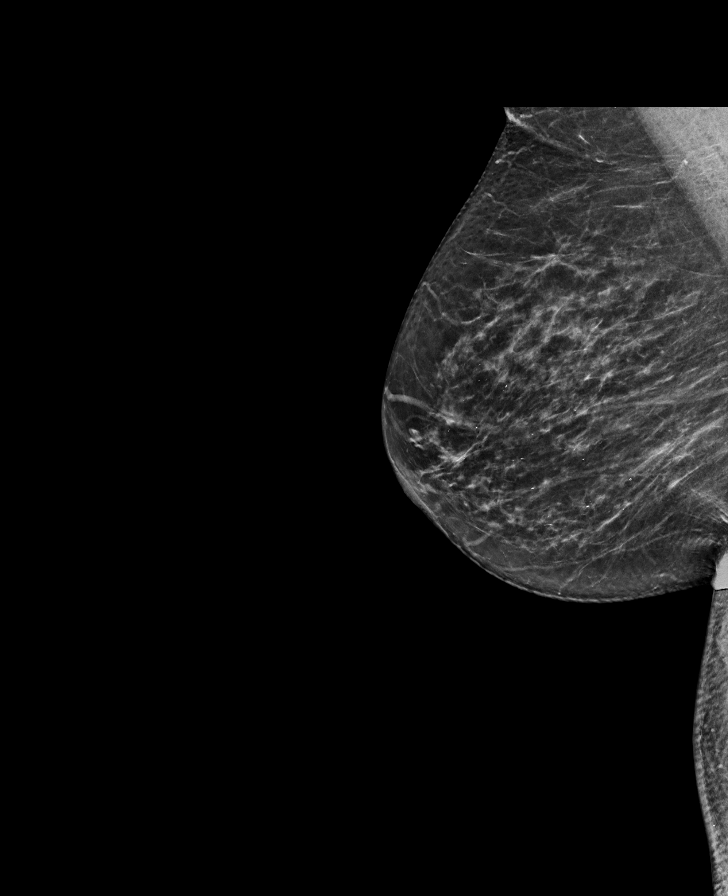

[R CC synth-2D]
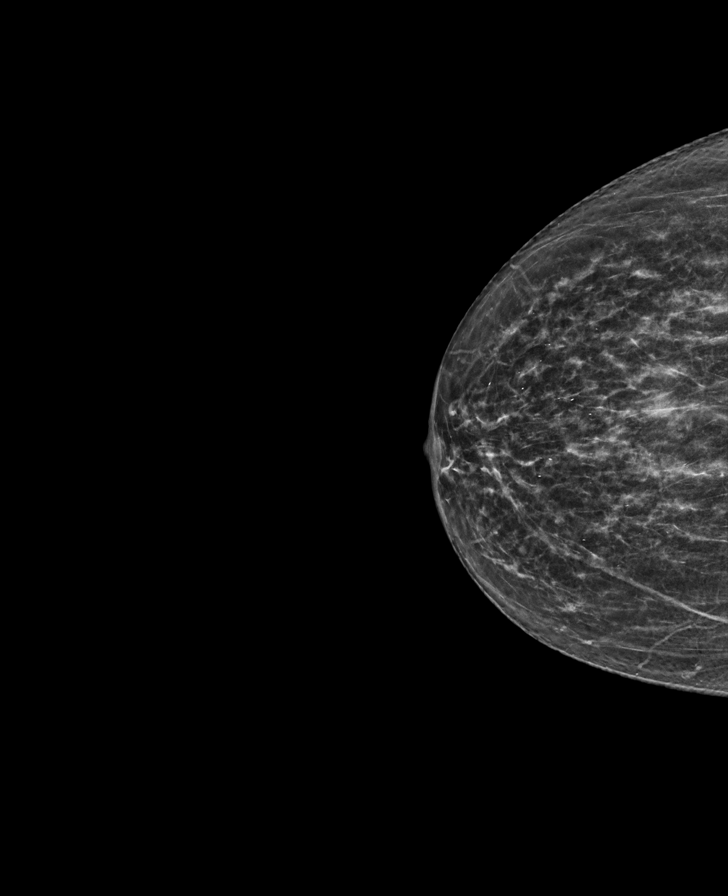

[L CC tomo · tomo slice 27/53.0]
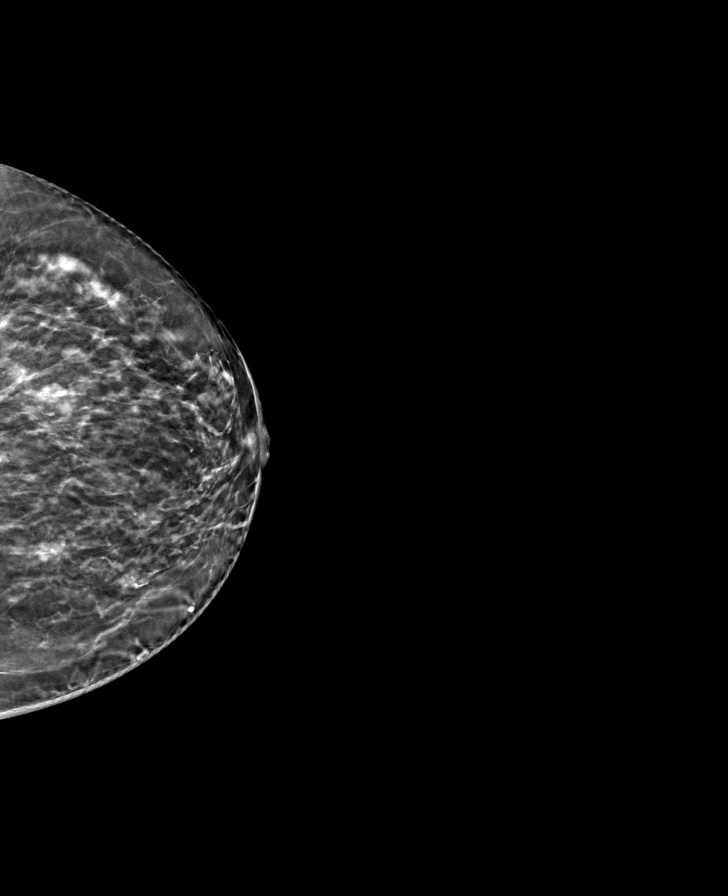

[L MLO tomo · tomo slice 32/63.0]
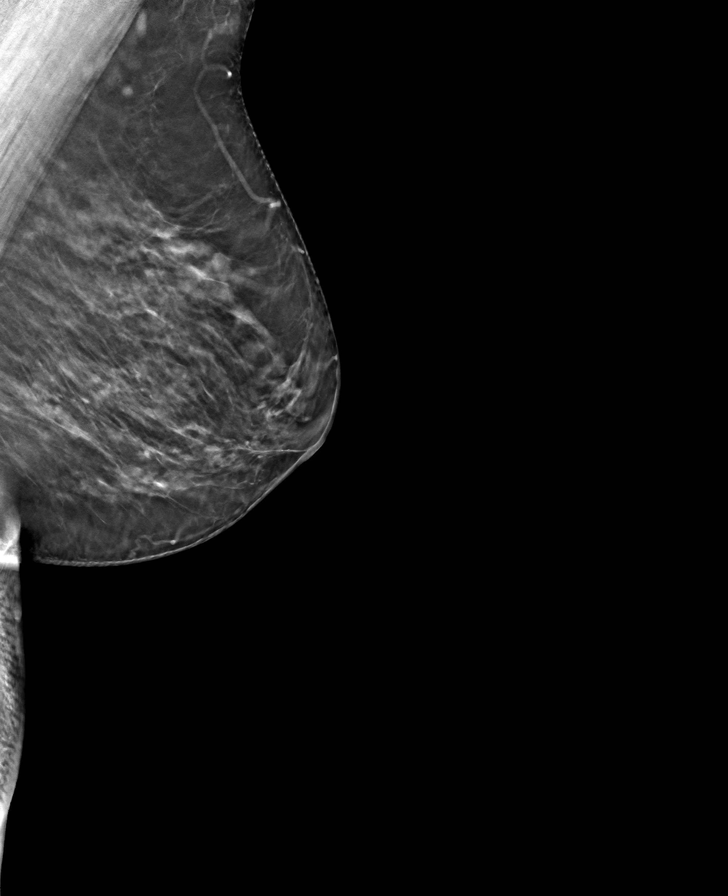

[R CC tomo · tomo slice 27/53.0]
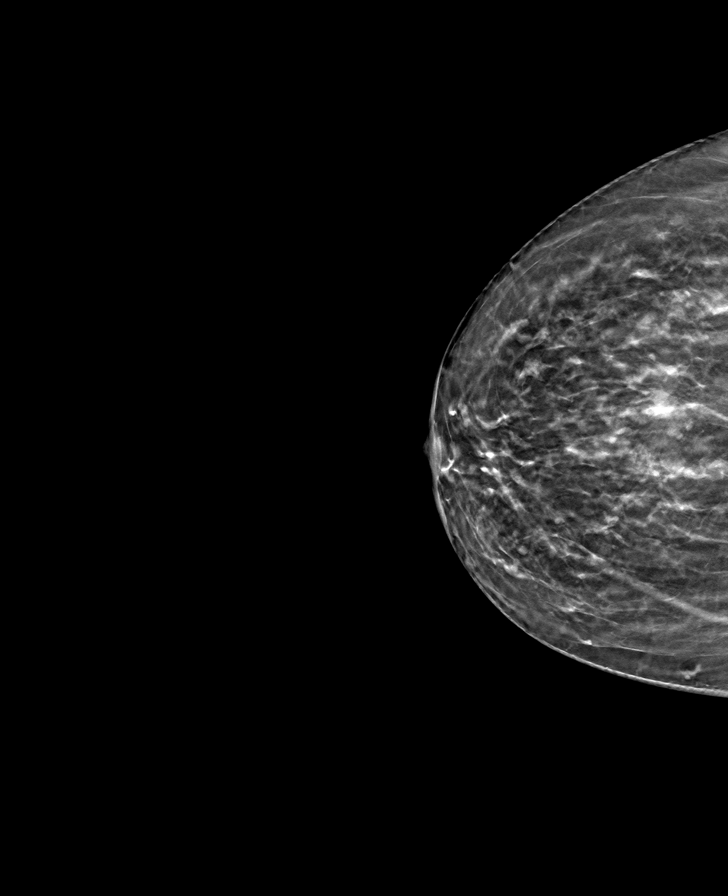

[R MLO tomo · tomo slice 33/64.0]
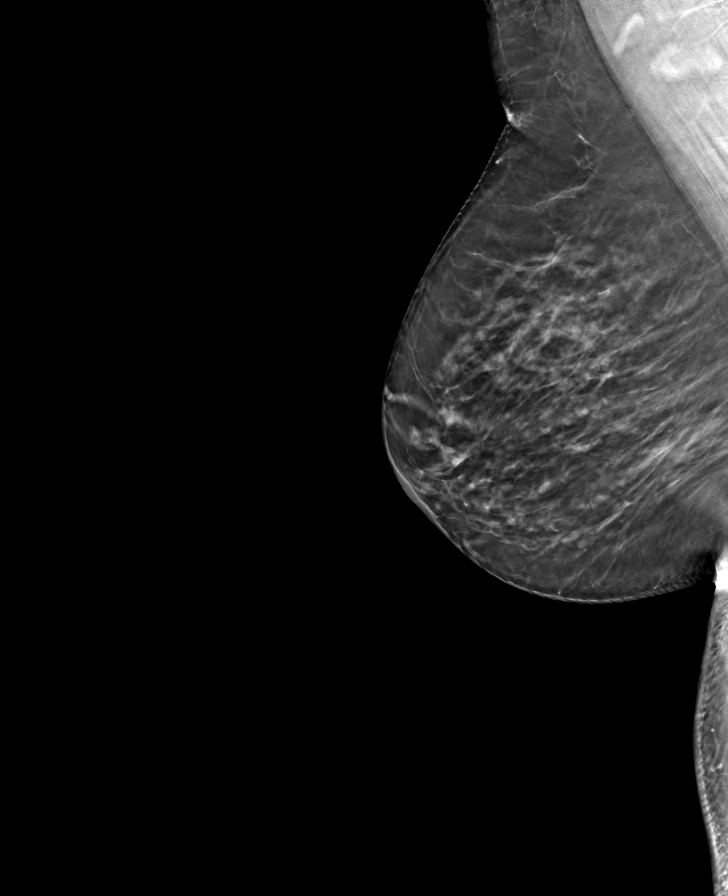

[8 of 24 positions shown; findings below may reference images not displayed]

ACR Breast Density Category c: The breast tissue is heterogeneously
dense, which may obscure small masses.
FINDINGS: There are no findings suspicious for malignancy.
IMPRESSION: No mammographic evidence of malignancy. A result letter of this
screening mammogram will be mailed directly to the patient.

RECOMMENDATION:
Screening mammogram in one year. (Code:Q3-W-BC3)

BI-RADS CATEGORY  1: Negative.

## 2022-07-12 DIAGNOSIS — I10 Essential (primary) hypertension: Secondary | ICD-10-CM | POA: Diagnosis not present

## 2022-07-12 DIAGNOSIS — M6249 Contracture of muscle, multiple sites: Secondary | ICD-10-CM | POA: Diagnosis not present

## 2022-07-31 DIAGNOSIS — S61019A Laceration without foreign body of unspecified thumb without damage to nail, initial encounter: Secondary | ICD-10-CM | POA: Diagnosis not present

## 2022-07-31 DIAGNOSIS — W540XXA Bitten by dog, initial encounter: Secondary | ICD-10-CM | POA: Diagnosis not present

## 2022-08-14 DIAGNOSIS — M9903 Segmental and somatic dysfunction of lumbar region: Secondary | ICD-10-CM | POA: Diagnosis not present

## 2022-08-14 DIAGNOSIS — M9904 Segmental and somatic dysfunction of sacral region: Secondary | ICD-10-CM | POA: Diagnosis not present

## 2022-08-14 DIAGNOSIS — M9902 Segmental and somatic dysfunction of thoracic region: Secondary | ICD-10-CM | POA: Diagnosis not present

## 2022-08-28 DIAGNOSIS — M9903 Segmental and somatic dysfunction of lumbar region: Secondary | ICD-10-CM | POA: Diagnosis not present

## 2022-08-28 DIAGNOSIS — M9904 Segmental and somatic dysfunction of sacral region: Secondary | ICD-10-CM | POA: Diagnosis not present

## 2022-08-28 DIAGNOSIS — M9902 Segmental and somatic dysfunction of thoracic region: Secondary | ICD-10-CM | POA: Diagnosis not present

## 2022-09-14 DIAGNOSIS — M9903 Segmental and somatic dysfunction of lumbar region: Secondary | ICD-10-CM | POA: Diagnosis not present

## 2022-09-14 DIAGNOSIS — M9904 Segmental and somatic dysfunction of sacral region: Secondary | ICD-10-CM | POA: Diagnosis not present

## 2022-09-14 DIAGNOSIS — M9902 Segmental and somatic dysfunction of thoracic region: Secondary | ICD-10-CM | POA: Diagnosis not present

## 2022-10-10 DIAGNOSIS — M9902 Segmental and somatic dysfunction of thoracic region: Secondary | ICD-10-CM | POA: Diagnosis not present

## 2022-10-10 DIAGNOSIS — M9904 Segmental and somatic dysfunction of sacral region: Secondary | ICD-10-CM | POA: Diagnosis not present

## 2022-10-10 DIAGNOSIS — M9903 Segmental and somatic dysfunction of lumbar region: Secondary | ICD-10-CM | POA: Diagnosis not present

## 2022-12-11 DIAGNOSIS — M9904 Segmental and somatic dysfunction of sacral region: Secondary | ICD-10-CM | POA: Diagnosis not present

## 2022-12-11 DIAGNOSIS — M9902 Segmental and somatic dysfunction of thoracic region: Secondary | ICD-10-CM | POA: Diagnosis not present

## 2022-12-11 DIAGNOSIS — M9903 Segmental and somatic dysfunction of lumbar region: Secondary | ICD-10-CM | POA: Diagnosis not present

## 2022-12-21 DIAGNOSIS — M9903 Segmental and somatic dysfunction of lumbar region: Secondary | ICD-10-CM | POA: Diagnosis not present

## 2022-12-21 DIAGNOSIS — M9902 Segmental and somatic dysfunction of thoracic region: Secondary | ICD-10-CM | POA: Diagnosis not present

## 2022-12-21 DIAGNOSIS — M9904 Segmental and somatic dysfunction of sacral region: Secondary | ICD-10-CM | POA: Diagnosis not present

## 2023-01-01 DIAGNOSIS — I77819 Aortic ectasia, unspecified site: Secondary | ICD-10-CM | POA: Diagnosis not present

## 2023-01-01 DIAGNOSIS — M5430 Sciatica, unspecified side: Secondary | ICD-10-CM | POA: Diagnosis not present

## 2023-01-01 DIAGNOSIS — M5137 Other intervertebral disc degeneration, lumbosacral region: Secondary | ICD-10-CM | POA: Diagnosis not present

## 2023-01-01 DIAGNOSIS — M5126 Other intervertebral disc displacement, lumbar region: Secondary | ICD-10-CM | POA: Diagnosis not present

## 2023-01-14 DIAGNOSIS — M5126 Other intervertebral disc displacement, lumbar region: Secondary | ICD-10-CM | POA: Diagnosis not present

## 2023-01-14 DIAGNOSIS — M48061 Spinal stenosis, lumbar region without neurogenic claudication: Secondary | ICD-10-CM | POA: Diagnosis not present

## 2023-01-14 DIAGNOSIS — M5136 Other intervertebral disc degeneration, lumbar region: Secondary | ICD-10-CM | POA: Diagnosis not present

## 2023-01-16 DIAGNOSIS — M5137 Other intervertebral disc degeneration, lumbosacral region: Secondary | ICD-10-CM | POA: Diagnosis not present

## 2023-01-16 DIAGNOSIS — M5126 Other intervertebral disc displacement, lumbar region: Secondary | ICD-10-CM | POA: Diagnosis not present

## 2023-01-16 DIAGNOSIS — M5430 Sciatica, unspecified side: Secondary | ICD-10-CM | POA: Diagnosis not present

## 2023-01-21 DIAGNOSIS — R92333 Mammographic heterogeneous density, bilateral breasts: Secondary | ICD-10-CM | POA: Diagnosis not present

## 2023-01-21 DIAGNOSIS — Z1231 Encounter for screening mammogram for malignant neoplasm of breast: Secondary | ICD-10-CM | POA: Diagnosis not present

## 2023-01-29 DIAGNOSIS — M461 Sacroiliitis, not elsewhere classified: Secondary | ICD-10-CM | POA: Diagnosis not present

## 2023-01-29 DIAGNOSIS — J4489 Other specified chronic obstructive pulmonary disease: Secondary | ICD-10-CM | POA: Diagnosis not present

## 2023-01-29 DIAGNOSIS — N390 Urinary tract infection, site not specified: Secondary | ICD-10-CM | POA: Diagnosis not present

## 2023-01-29 DIAGNOSIS — M6249 Contracture of muscle, multiple sites: Secondary | ICD-10-CM | POA: Diagnosis not present

## 2023-01-29 DIAGNOSIS — Z23 Encounter for immunization: Secondary | ICD-10-CM | POA: Diagnosis not present

## 2023-01-29 DIAGNOSIS — F17210 Nicotine dependence, cigarettes, uncomplicated: Secondary | ICD-10-CM | POA: Diagnosis not present

## 2023-01-29 DIAGNOSIS — I10 Essential (primary) hypertension: Secondary | ICD-10-CM | POA: Diagnosis not present

## 2023-01-29 DIAGNOSIS — Z0000 Encounter for general adult medical examination without abnormal findings: Secondary | ICD-10-CM | POA: Diagnosis not present

## 2023-01-29 DIAGNOSIS — E039 Hypothyroidism, unspecified: Secondary | ICD-10-CM | POA: Diagnosis not present

## 2023-01-29 DIAGNOSIS — E7800 Pure hypercholesterolemia, unspecified: Secondary | ICD-10-CM | POA: Diagnosis not present

## 2023-02-15 DIAGNOSIS — M47816 Spondylosis without myelopathy or radiculopathy, lumbar region: Secondary | ICD-10-CM | POA: Diagnosis not present

## 2023-02-15 DIAGNOSIS — M48062 Spinal stenosis, lumbar region with neurogenic claudication: Secondary | ICD-10-CM | POA: Diagnosis not present

## 2023-02-15 DIAGNOSIS — M816 Localized osteoporosis [Lequesne]: Secondary | ICD-10-CM | POA: Diagnosis not present

## 2023-02-15 DIAGNOSIS — M545 Low back pain, unspecified: Secondary | ICD-10-CM | POA: Diagnosis not present

## 2023-02-15 DIAGNOSIS — M47817 Spondylosis without myelopathy or radiculopathy, lumbosacral region: Secondary | ICD-10-CM | POA: Diagnosis not present

## 2023-02-15 DIAGNOSIS — M8589 Other specified disorders of bone density and structure, multiple sites: Secondary | ICD-10-CM | POA: Diagnosis not present

## 2023-02-15 DIAGNOSIS — M5136 Other intervertebral disc degeneration, lumbar region with discogenic back pain only: Secondary | ICD-10-CM | POA: Diagnosis not present

## 2023-02-15 DIAGNOSIS — M85852 Other specified disorders of bone density and structure, left thigh: Secondary | ICD-10-CM | POA: Diagnosis not present

## 2023-03-12 DIAGNOSIS — M5416 Radiculopathy, lumbar region: Secondary | ICD-10-CM | POA: Diagnosis not present

## 2023-03-12 DIAGNOSIS — M461 Sacroiliitis, not elsewhere classified: Secondary | ICD-10-CM | POA: Diagnosis not present

## 2023-07-30 DIAGNOSIS — R059 Cough, unspecified: Secondary | ICD-10-CM | POA: Diagnosis not present

## 2023-07-30 DIAGNOSIS — M6249 Contracture of muscle, multiple sites: Secondary | ICD-10-CM | POA: Diagnosis not present

## 2023-07-30 DIAGNOSIS — J069 Acute upper respiratory infection, unspecified: Secondary | ICD-10-CM | POA: Diagnosis not present

## 2023-07-31 DIAGNOSIS — M47816 Spondylosis without myelopathy or radiculopathy, lumbar region: Secondary | ICD-10-CM | POA: Diagnosis not present

## 2023-07-31 DIAGNOSIS — M47814 Spondylosis without myelopathy or radiculopathy, thoracic region: Secondary | ICD-10-CM | POA: Diagnosis not present

## 2023-07-31 DIAGNOSIS — M48062 Spinal stenosis, lumbar region with neurogenic claudication: Secondary | ICD-10-CM | POA: Diagnosis not present

## 2023-08-02 DIAGNOSIS — M48062 Spinal stenosis, lumbar region with neurogenic claudication: Secondary | ICD-10-CM | POA: Diagnosis not present

## 2023-08-08 DIAGNOSIS — M5441 Lumbago with sciatica, right side: Secondary | ICD-10-CM | POA: Diagnosis not present

## 2023-08-08 DIAGNOSIS — M5416 Radiculopathy, lumbar region: Secondary | ICD-10-CM | POA: Diagnosis not present

## 2023-08-08 DIAGNOSIS — G8929 Other chronic pain: Secondary | ICD-10-CM | POA: Diagnosis not present

## 2023-08-08 DIAGNOSIS — M7918 Myalgia, other site: Secondary | ICD-10-CM | POA: Diagnosis not present

## 2023-08-08 DIAGNOSIS — M5442 Lumbago with sciatica, left side: Secondary | ICD-10-CM | POA: Diagnosis not present

## 2023-10-16 DIAGNOSIS — M5416 Radiculopathy, lumbar region: Secondary | ICD-10-CM | POA: Diagnosis not present

## 2023-10-25 DIAGNOSIS — M9901 Segmental and somatic dysfunction of cervical region: Secondary | ICD-10-CM | POA: Diagnosis not present

## 2023-11-11 DIAGNOSIS — M545 Low back pain, unspecified: Secondary | ICD-10-CM | POA: Diagnosis not present

## 2023-11-11 DIAGNOSIS — M791 Myalgia, unspecified site: Secondary | ICD-10-CM | POA: Diagnosis not present

## 2023-11-11 DIAGNOSIS — M542 Cervicalgia: Secondary | ICD-10-CM | POA: Diagnosis not present

## 2023-11-11 DIAGNOSIS — R319 Hematuria, unspecified: Secondary | ICD-10-CM | POA: Diagnosis not present

## 2023-11-11 DIAGNOSIS — M546 Pain in thoracic spine: Secondary | ICD-10-CM | POA: Diagnosis not present

## 2023-11-20 DIAGNOSIS — M47812 Spondylosis without myelopathy or radiculopathy, cervical region: Secondary | ICD-10-CM | POA: Diagnosis not present

## 2023-11-20 DIAGNOSIS — M7918 Myalgia, other site: Secondary | ICD-10-CM | POA: Diagnosis not present

## 2023-11-20 DIAGNOSIS — M5416 Radiculopathy, lumbar region: Secondary | ICD-10-CM | POA: Diagnosis not present

## 2023-11-20 DIAGNOSIS — I10 Essential (primary) hypertension: Secondary | ICD-10-CM | POA: Diagnosis not present

## 2023-11-20 DIAGNOSIS — G8929 Other chronic pain: Secondary | ICD-10-CM | POA: Diagnosis not present

## 2023-11-20 DIAGNOSIS — M542 Cervicalgia: Secondary | ICD-10-CM | POA: Diagnosis not present

## 2023-12-17 DIAGNOSIS — M542 Cervicalgia: Secondary | ICD-10-CM | POA: Diagnosis not present

## 2023-12-17 DIAGNOSIS — M5416 Radiculopathy, lumbar region: Secondary | ICD-10-CM | POA: Diagnosis not present

## 2023-12-17 DIAGNOSIS — M7918 Myalgia, other site: Secondary | ICD-10-CM | POA: Diagnosis not present

## 2023-12-17 DIAGNOSIS — I10 Essential (primary) hypertension: Secondary | ICD-10-CM | POA: Diagnosis not present

## 2023-12-17 DIAGNOSIS — M5030 Other cervical disc degeneration, unspecified cervical region: Secondary | ICD-10-CM | POA: Diagnosis not present

## 2023-12-17 DIAGNOSIS — G8929 Other chronic pain: Secondary | ICD-10-CM | POA: Diagnosis not present

## 2024-01-29 DIAGNOSIS — Z1231 Encounter for screening mammogram for malignant neoplasm of breast: Secondary | ICD-10-CM | POA: Diagnosis not present

## 2024-01-29 DIAGNOSIS — R92323 Mammographic fibroglandular density, bilateral breasts: Secondary | ICD-10-CM | POA: Diagnosis not present
